# Patient Record
Sex: Male | Born: 1995 | Race: Black or African American | Hispanic: No | Marital: Single | Smoking: Never smoker
Health system: Southern US, Community
[De-identification: ages and names within clinical notes are randomized; demographics above are authoritative.]

## PROBLEM LIST (undated history)

## (undated) DIAGNOSIS — Z5189 Encounter for other specified aftercare: Secondary | ICD-10-CM

## (undated) HISTORY — DX: Encounter for other specified aftercare: Z51.89

## (undated) HISTORY — PX: ADENOIDECTOMY: SUR15

## (undated) HISTORY — PX: URETHRA SURGERY: SHX824

---
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
## 2018-08-19 ENCOUNTER — Ambulatory Visit (INDEPENDENT_AMBULATORY_CARE_PROVIDER_SITE_OTHER): Payer: BLUE CROSS/BLUE SHIELD | Admitting: Family Medicine

## 2018-08-19 ENCOUNTER — Encounter: Payer: Self-pay | Admitting: Family Medicine

## 2018-08-19 ENCOUNTER — Other Ambulatory Visit: Payer: Self-pay

## 2018-08-19 VITALS — BP 120/73 | HR 59 | Temp 98.5°F | Resp 16 | Ht 68.5 in | Wt 153.6 lb

## 2018-08-19 DIAGNOSIS — Z0000 Encounter for general adult medical examination without abnormal findings: Secondary | ICD-10-CM

## 2018-08-19 DIAGNOSIS — Z23 Encounter for immunization: Secondary | ICD-10-CM

## 2018-08-19 DIAGNOSIS — H9193 Unspecified hearing loss, bilateral: Secondary | ICD-10-CM | POA: Insufficient documentation

## 2018-08-19 NOTE — Assessment & Plan Note (Signed)
Subjective hearing loss Ear exam benign Will send to audiology for formal testing

## 2018-08-19 NOTE — Progress Notes (Signed)
Patient: Nicholas Walter, Male    DOB: July 08, 1995, 23 y.o.   MRN: [id number] Visit Date: 08/19/2018  Today's Provider: Lavon Paganini, MD   Chief Complaint  Patient presents with  . New Patient (Initial Visit)   Subjective:    Annual physical exam Nicholas Walter is a 23 y.o. male who presents today for health maintenance and complete physical. He feels well. He reports exercising by walking outdoors daily. He reports he is sleeping well.  ----------------------------------------------------------------- Some mild hearing loss in bilateral hears Wax build up? Happened previously when he was a child  Denies any other medical problems.    Review of Systems  Constitutional: Negative.   HENT: Positive for hearing loss.   Eyes: Negative.   Respiratory: Negative.   Cardiovascular: Negative.   Gastrointestinal: Negative.   Endocrine: Negative.   Genitourinary: Negative.   Musculoskeletal: Negative.   Skin: Negative.   Allergic/Immunologic: Negative.   Neurological: Negative.   Hematological: Negative.   Psychiatric/Behavioral: Negative.     Social History He  reports that he has never smoked. He has never used smokeless tobacco. He reports that he does not drink alcohol or use drugs. Social History   Socioeconomic History  . Marital status: Single    Spouse name: Not on file  . Number of children: Not on file  . Years of education: Not on file  . Highest education level: Not on file  Occupational History  . Not on file  Social Needs  . Financial resource strain: Not on file  . Food insecurity    Worry: Not on file    Inability: Not on file  . Transportation needs    Medical: Not on file    Non-medical: Not on file  Tobacco Use  . Smoking status: Never Smoker  . Smokeless tobacco: Never Used  Substance and Sexual Activity  . Alcohol use: Never    Frequency: Never  . Drug use: Never  . Sexual activity: Not Currently  Lifestyle  .  Physical activity    Days per week: Not on file    Minutes per session: Not on file  . Stress: Not on file  Relationships  . Social Herbalist on phone: Not on file    Gets together: Not on file    Attends religious service: Not on file    Active member of club or organization: Not on file    Attends meetings of clubs or organizations: Not on file    Relationship status: Not on file  Other Topics Concern  . Not on file  Social History Narrative  . Not on file    Patient Active Problem List   Diagnosis Date Noted  . Bilateral hearing loss 08/19/2018    Past Surgical History:  Procedure Laterality Date  . ADENOIDECTOMY    . URETHRA SURGERY      Family History  Family Status  Relation Name Status  . Mother  Alive  . Father  (Not Specified)  . Neg Hx  (Not Specified)   His family history includes Arthritis in his mother; Asthma in his mother; Healthy in his father; Hypertension in his mother.     No Known Allergies  Previous Medications   No medications on file    Patient Care Team: Virginia Crews, MD as PCP - General (Family Medicine)      Objective:   Vitals: BP 120/73   Pulse (!) 59  Temp 98.5 F (36.9 C) (Oral)   Resp 16   Ht 5' 8.5" (1.74 m)   Wt 153 lb 9.6 oz (69.7 kg)   BMI 23.02 kg/m    Physical Exam Vitals signs reviewed.  Constitutional:      General: He is not in acute distress.    Appearance: Normal appearance. He is well-developed. He is not diaphoretic.  HENT:     Head: Normocephalic and atraumatic.     Right Ear: Tympanic membrane, ear canal and external ear normal.     Left Ear: Tympanic membrane, ear canal and external ear normal.  Eyes:     General: No scleral icterus.    Conjunctiva/sclera: Conjunctivae normal.     Pupils: Pupils are equal, round, and reactive to light.  Neck:     Musculoskeletal: Neck supple.     Thyroid: No thyromegaly.  Cardiovascular:     Rate and Rhythm: Normal rate and regular rhythm.      Pulses: Normal pulses.     Heart sounds: Normal heart sounds. No murmur.  Pulmonary:     Effort: Pulmonary effort is normal. No respiratory distress.     Breath sounds: Normal breath sounds. No wheezing or rales.  Abdominal:     General: There is no distension.     Palpations: Abdomen is soft.     Tenderness: There is no abdominal tenderness.  Musculoskeletal:        General: No deformity.     Right lower leg: No edema.     Left lower leg: No edema.  Lymphadenopathy:     Cervical: No cervical adenopathy.  Skin:    General: Skin is warm and dry.     Capillary Refill: Capillary refill takes less than 2 seconds.     Findings: No rash.  Neurological:     Mental Status: He is alert and oriented to person, place, and time. Mental status is at baseline.  Psychiatric:        Mood and Affect: Mood normal.        Behavior: Behavior normal.        Thought Content: Thought content normal.      Depression Screen PHQ 2/9 Scores 08/19/2018  PHQ - 2 Score 0  PHQ- 9 Score 0      Assessment & Plan:     Routine Health Maintenance and Physical Exam  Exercise Activities and Dietary recommendations Goals   None     There is no immunization history for the selected administration types on file for this patient.  Health Maintenance  Topic Date Due  . HIV Screening  02/13/2009  . TETANUS/TDAP  02/13/2013  . INFLUENZA VACCINE  09/03/2018     Discussed health benefits of physical activity, and encouraged him to engage in regular exercise appropriate for his age and condition.    -------------------------------------------------------------------- Rae LipsI,Kathleen J Wolford,acting as a scribe for Shirlee LatchAngela Bacigalupo, MD.,have documented all relevant documentation on the behalf of Shirlee LatchAngela Bacigalupo, MD,as directed by  Shirlee LatchAngela Bacigalupo, MD while in the presence of Shirlee LatchAngela Bacigalupo, MD.   Problem List Items Addressed This Visit      Nervous and Auditory   Bilateral hearing loss     Subjective hearing loss Ear exam benign Will send to audiology for formal testing      Relevant Orders   Ambulatory referral to Audiology    Other Visit Diagnoses    Encounter for annual physical exam    -  Primary   Need for tetanus  booster       Relevant Orders   Tdap vaccine greater than or equal to 7yo IM       Return in about 1 year (around 08/19/2019) for CPE.   The entirety of the information documented in the History of Present Illness, Review of Systems and Physical Exam were personally obtained by me. Portions of this information were initially documented by Sheliah HatchKathleen Wolford, CMA and reviewed by me for thoroughness and accuracy.    Bacigalupo, Marzella SchleinAngela M, MD MPH [facility]

## 2018-08-19 NOTE — Patient Instructions (Signed)
Preventive Care 19-24 Years Old, Male Preventive care refers to lifestyle choices and visits with your health care provider that can promote health and wellness. This includes:  A yearly physical exam. This is also called an annual well check.  Regular dental and eye exams.  Immunizations.  Screening for certain conditions.  Healthy lifestyle choices, such as eating a healthy diet, getting regular exercise, not using drugs or products that contain nicotine and tobacco, and limiting alcohol use. What can I expect for my preventive care visit? Physical exam Your health care provider will check:  Height and weight. These may be used to calculate body mass index (BMI), which is a measurement that tells if you are at a healthy weight.  Heart rate and blood pressure.  Your skin for abnormal spots. Counseling Your health care provider may ask you questions about:  Alcohol, tobacco, and drug use.  Emotional well-being.  Home and relationship well-being.  Sexual activity.  Eating habits.  Work and work Statistician. What immunizations do I need?  Influenza (flu) vaccine  This is recommended every year. Tetanus, diphtheria, and pertussis (Tdap) vaccine  You may need a Td booster every 10 years. Varicella (chickenpox) vaccine  You may need this vaccine if you have not already been vaccinated. Human papillomavirus (HPV) vaccine  If recommended by your health care provider, you may need three doses over 6 months. Measles, mumps, and rubella (MMR) vaccine  You may need at least one dose of MMR. You may also need a second dose. Meningococcal conjugate (MenACWY) vaccine  One dose is recommended if you are 45-76 years old and a Market researcher living in a residence hall, or if you have one of several medical conditions. You may also need additional booster doses. Pneumococcal conjugate (PCV13) vaccine  You may need this if you have certain conditions and were not  previously vaccinated. Pneumococcal polysaccharide (PPSV23) vaccine  You may need one or two doses if you smoke cigarettes or if you have certain conditions. Hepatitis A vaccine  You may need this if you have certain conditions or if you travel or work in places where you may be exposed to hepatitis A. Hepatitis B vaccine  You may need this if you have certain conditions or if you travel or work in places where you may be exposed to hepatitis B. Haemophilus influenzae type b (Hib) vaccine  You may need this if you have certain risk factors. You may receive vaccines as individual doses or as more than one vaccine together in one shot (combination vaccines). Talk with your health care provider about the risks and benefits of combination vaccines. What tests do I need? Blood tests  Lipid and cholesterol levels. These may be checked every 5 years starting at age 17.  Hepatitis C test.  Hepatitis B test. Screening   Diabetes screening. This is done by checking your blood sugar (glucose) after you have not eaten for a while (fasting).  Sexually transmitted disease (STD) testing. Talk with your health care provider about your test results, treatment options, and if necessary, the need for more tests. Follow these instructions at home: Eating and drinking   Eat a diet that includes fresh fruits and vegetables, whole grains, lean protein, and low-fat dairy products.  Take vitamin and mineral supplements as recommended by your health care provider.  Do not drink alcohol if your health care provider tells you not to drink.  If you drink alcohol: ? Limit how much you have to 0-2  drinks a day. ? Be aware of how much alcohol is in your drink. In the [place] one drink equals one 12 oz bottle of beer (355 mL), one 5 oz glass of wine (148 mL), or one 1 oz glass of hard liquor (44 mL). Lifestyle  Take daily care of your teeth and gums.  Stay active. Exercise for at least 30 minutes on 5 or  more days each week.  Do not use any products that contain nicotine or tobacco, such as cigarettes, e-cigarettes, and chewing tobacco. If you need help quitting, ask your health care provider.  If you are sexually active, practice safe sex. Use a condom or other form of protection to prevent STIs (sexually transmitted infections). What's next?  Go to your health care provider once a year for a well check visit.  Ask your health care provider how often you should have your eyes and teeth checked.  Stay up to date on all vaccines. This information is not intended to replace advice given to you by your health care provider. Make sure you discuss any questions you have with your health care provider. Document Released: 03/17/2001 Document Revised: 01/13/2018 Document Reviewed: 01/13/2018 Elsevier Patient Education  2020 Elsevier Inc.  

## 2019-01-17 ENCOUNTER — Encounter: Payer: Self-pay | Admitting: Family Medicine

## 2019-01-17 ENCOUNTER — Ambulatory Visit (INDEPENDENT_AMBULATORY_CARE_PROVIDER_SITE_OTHER): Payer: BLUE CROSS/BLUE SHIELD | Admitting: Family Medicine

## 2019-01-17 ENCOUNTER — Other Ambulatory Visit: Payer: Self-pay

## 2019-01-17 VITALS — BP 127/68 | HR 71 | Temp 97.7°F | Resp 16 | Wt 158.4 lb

## 2019-01-17 DIAGNOSIS — Z9889 Other specified postprocedural states: Secondary | ICD-10-CM | POA: Diagnosis not present

## 2019-01-17 DIAGNOSIS — R339 Retention of urine, unspecified: Secondary | ICD-10-CM

## 2019-01-17 NOTE — Progress Notes (Signed)
Patient: Nicholas Walter Male    DOB: January 17, 1996   23 y.o.   MRN: [id number] Visit Date: 01/17/2019  Today's Provider: Shirlee Latch, MD   Chief Complaint  Patient presents with  . Body Fluid Exposure   Subjective:    I [place] S. Dimas, CMA, am acting as scribe for Shirlee Latch, MD.  HPI Patient here today C/O small urine output and hesitance. Patient reports this has been going on for several years. Patient reports that he does hold his urine often due to activities, work or sleep. Patient reports that when he does void the flow is very slow and voids in small amounts. Feels like he has some urine left in his bladder after emptying.  Thinks he may have seen urology about this.  Had urethra surgery as a child.  No dysuria, no frequency/urgency, no hematuria.   No Known Allergies  No current outpatient medications on file.  Review of Systems  Constitutional: Negative.   Cardiovascular: Negative.   Genitourinary: Positive for decreased urine volume and difficulty urinating. Negative for dysuria, enuresis, frequency and hematuria.    Social History   Tobacco Use  . Smoking status: Never Smoker  . Smokeless tobacco: Never Used  Substance Use Topics  . Alcohol use: Never      Objective:   BP 127/68 (BP Location: Right Arm, Patient Position: Sitting, Cuff Size: Normal)   Pulse 71   Temp 97.7 F (36.5 C) (Temporal)   Resp 16   Wt 158 lb 6.4 oz (71.8 kg)   BMI 23.73 kg/m  Vitals:   01/17/19 0914  BP: 127/68  Pulse: 71  Resp: 16  Temp: 97.7 F (36.5 C)  TempSrc: Temporal  Weight: 158 lb 6.4 oz (71.8 kg)  Body mass index is 23.73 kg/m.   Physical Exam Vitals reviewed.  Constitutional:      General: He is not in acute distress.    Appearance: Normal appearance. He is well-developed. He is not diaphoretic.  HENT:     Head: Normocephalic and atraumatic.  Eyes:     General: No scleral icterus.    Conjunctiva/sclera: Conjunctivae normal.    Neck:     Thyroid: No thyromegaly.  Cardiovascular:     Rate and Rhythm: Normal rate and regular rhythm.     Heart sounds: Normal heart sounds. No murmur.  Pulmonary:     Effort: Pulmonary effort is normal. No respiratory distress.     Breath sounds: Normal breath sounds. No wheezing or rales.  Abdominal:     General: There is no distension.     Palpations: Abdomen is soft.     Tenderness: There is no abdominal tenderness. There is no guarding or rebound.  Genitourinary:    Comments: Patient defers to urology Musculoskeletal:        General: No deformity.     Cervical back: Neck supple.     Right lower leg: No edema.     Left lower leg: No edema.  Lymphadenopathy:     Cervical: No cervical adenopathy.  Skin:    General: Skin is warm and dry.     Capillary Refill: Capillary refill takes less than 2 seconds.     Findings: No rash.  Neurological:     Mental Status: He is alert and oriented to person, place, and time. Mental status is at baseline.  Psychiatric:        Mood and Affect: Mood normal.  Behavior: Behavior normal.      No results found for any visits on 01/17/19. Patient unable to give urine sample today    Assessment & Plan    1. Urinary retention 2. History of urologic surgery - longstanding problem, but newly discussed - patient with history of urologic surgery in childhood (but history is poor, so unclear what exactly was done) - concern for possible urethral stricture or prostate etiology - no infectious signs/symptoms - referral to URology for further eval and management - Ambulatory referral to Urology    Return if symptoms worsen or fail to improve.   The entirety of the information documented in the History of Present Illness, Review of Systems and Physical Exam were personally obtained by me. Portions of this information were initially documented by Lynford Humphrey, CMA and reviewed by me for thoroughness and accuracy.    Darric Plante,  Dionne Bucy, MD MPH [facility]

## 2019-05-02 NOTE — Progress Notes (Signed)
   05/03/19 12:22 PM   Nicholas Walter 1995-08-11 [id number]  Referring provider: Erasmo Downer, MD [address] [place],  [place] [address]  Chief Complaint  Patient presents with  . Urinary Retention    HPI: 24 y.o. male presents today for the evaluation and management of LUTS.   -PCP visit 01/17/19 c/o small urine output and hesitancy for several years -Today, bladder problems onset 24 years old -hx of holding urine -unable to urinate today however reports of a shy bladder, PVR 658 mL  -intermittent worsening of syptoms over the years  -denies gross hematuria and infection  -hx of urethra surgery as child   PMH: Past Medical History:  Diagnosis Date  . Blood transfusion without reported diagnosis     Surgical History: Past Surgical History:  Procedure Laterality Date  . ADENOIDECTOMY    . URETHRA SURGERY      Home Medications:  Allergies as of 05/03/2019   No Known Allergies     Medication List    as of May 03, 2019 12:22 PM   You have not been prescribed any medications.     Allergies: No Known Allergies  Family History: Family History  Problem Relation Age of Onset  . Asthma Mother   . Arthritis Mother   . Hypertension Mother   . Healthy Father   . Cancer Neg Hx     Social History:  reports that he has never smoked. He has never used smokeless tobacco. He reports that he does not drink alcohol or use drugs.   Physical Exam: BP 108/72   Pulse 65   Ht 5\' 9"  (1.753 m)   Wt 160 lb (72.6 kg)   BMI 23.63 kg/m   Constitutional:  Alert and oriented, No acute distress. HEENT: [place] AT, moist mucus membranes.  Trachea midline, no masses. Cardiovascular: No clubbing, cyanosis, or edema. Respiratory: Normal respiratory effort, no increased work of breathing. GU: No CVA tenderness. Circumcised and widely patent meatus.  Skin: No rashes, bruises or suspicious lesions. Neurologic: Grossly intact, no focal deficits, moving all 4  extremities. Psychiatric: Normal mood and affect.  Pertinent Imaging: PVR - 658 mL.   Assessment & Plan:    - Lower urinary tract symptoms Pt was unable to urinate today however reports of a shy bladder and states he can empty at home without problem Rx of tamsulosin given  F/u in 1 month with for symptom reassessment and bladder scan    Willodean Rosenthal, MD  [facility] [address], [address] [place], [place] [phone]  I, [phone], am acting as a scribe for Dr. Donne Hazel C. Kaisen Ackers,  I have reviewed the above documentation for accuracy and completeness, and I agree with the above.   Lorin Picket, MD

## 2019-05-03 ENCOUNTER — Ambulatory Visit (INDEPENDENT_AMBULATORY_CARE_PROVIDER_SITE_OTHER): Payer: 59 | Admitting: Urology

## 2019-05-03 ENCOUNTER — Encounter: Payer: Self-pay | Admitting: Urology

## 2019-05-03 ENCOUNTER — Other Ambulatory Visit: Payer: Self-pay

## 2019-05-03 VITALS — BP 108/72 | HR 65 | Ht 69.0 in | Wt 160.0 lb

## 2019-05-03 DIAGNOSIS — R399 Unspecified symptoms and signs involving the genitourinary system: Secondary | ICD-10-CM

## 2019-05-03 MED ORDER — TAMSULOSIN HCL 0.4 MG PO CAPS: 0.4000 mg | ORAL_CAPSULE | Freq: Every day | ORAL | 0 refills | Status: AC

## 2019-05-08 ENCOUNTER — Telehealth: Payer: Self-pay | Admitting: Family Medicine

## 2019-05-08 NOTE — Telephone Encounter (Signed)
Copied from CRM [id number]. Topic: Quick Communication - Rx Refill/Question >> May 08, 2019  2:09 PM Randol Kern wrote: Medication: tamsulosin (FLOMAX) 0.4 MG CAPS capsule  Pt is experiencing side effects, runny/stuffy nose and made him feel drained, and also problems ejaculating.   [phone]

## 2019-06-02 ENCOUNTER — Encounter: Payer: Self-pay | Admitting: Urology

## 2019-06-02 ENCOUNTER — Other Ambulatory Visit: Payer: Self-pay

## 2019-06-02 ENCOUNTER — Ambulatory Visit (INDEPENDENT_AMBULATORY_CARE_PROVIDER_SITE_OTHER): Payer: 59 | Admitting: Urology

## 2019-06-02 VITALS — BP 112/67 | HR 53 | Ht 69.0 in | Wt 158.4 lb

## 2019-06-02 DIAGNOSIS — R399 Unspecified symptoms and signs involving the genitourinary system: Secondary | ICD-10-CM

## 2019-06-02 LAB — BLADDER SCAN AMB NON-IMAGING: Scan Result: 36

## 2019-06-05 LAB — URINALYSIS, COMPLETE
Bilirubin, UA: NEGATIVE
Glucose, UA: NEGATIVE
Ketones, UA: NEGATIVE
Leukocytes,UA: NEGATIVE
Nitrite, UA: NEGATIVE
Protein,UA: NEGATIVE
RBC, UA: NEGATIVE
Specific Gravity, UA: >1.03 — ABNORMAL HIGH (ref 1.005–1.030)
Urobilinogen, Ur: 0.2 mg/dL (ref 0.2–1.0)
pH, UA: 5.5 (ref 5.0–7.5)

## 2019-06-05 LAB — MICROSCOPIC EXAMINATION
Bacteria, UA: NONE SEEN
Epithelial Cells (non renal): NONE SEEN /hpf (ref 0–10)
RBC: NONE SEEN /hpf (ref 0–2)

## 2019-06-22 NOTE — Progress Notes (Signed)
06/02/2019 8:13 AM   [name] May 19, 1995 [id number]  Referring provider: Erasmo Downer, MD [address] [place],  [place] [address]  Chief Complaint  Patient presents with  . lower urinary tract symptoms    HPI: [name] is a 24 year old male with lower urinary tract symptoms who presents today for a one month follow up with mother, Nicholas Walter on the phone.   He was seen by Dr. Lonna Cobb on 05/03/2019.  At that visit, his PVR was 658 mL.  The patient expressed that he had ongoing issues with the onset of age 64.  He states has a shy bladder, but it capable of urinating at home to empty his bladder.  He was given tamsulosin 0.4 mg daily and instructed to follow up in one month.  LUTS  (prostate and/or bladder) IPSS score: 13/2    PVR: 36 mL      Major complaint(s): Frequency x several years.   Patient denies any modifying or aggravating factors.  Patient denies any gross hematuria, dysuria or suprapubic/flank pain.  Patient denies any fevers, chills, nausea or vomiting.   He is not wanting to continue the tamsulosin at this time.    IPSS    Row Name 06/02/19 1000         International Prostate Symptom Score   How often have you had the sensation of not emptying your bladder?  Less than half the time     How often have you had to urinate less than every two hours?  Less than 1 in 5 times     How often have you found you stopped and started again several times when you urinated?  Less than half the time     How often have you found it difficult to postpone urination?  About half the time     How often have you had a weak urinary stream?  Not at All     How often have you had to strain to start urination?  Less than half the time     How many times did you typically get up at night to urinate?  3 Times     Total IPSS Score  13       Quality of Life due to urinary symptoms   If you were to spend the rest of your life with your urinary condition just  the way it is now how would you feel about that?  Mostly Satisfied        Score:  1-7 Mild 8-19 Moderate 20-35 Severe     PMH: Past Medical History:  Diagnosis Date  . Blood transfusion without reported diagnosis     Surgical History: Past Surgical History:  Procedure Laterality Date  . ADENOIDECTOMY    . URETHRA SURGERY      Home Medications:  Allergies as of 06/02/2019   No Known Allergies     Medication List       Accurate as of June 02, 2019 11:59 PM. If you have any questions, ask your nurse or doctor.        tamsulosin 0.4 MG Caps capsule Commonly known as: FLOMAX Take 1 capsule (0.4 mg total) by mouth daily.       Allergies: No Known Allergies  Family History: Family History  Problem Relation Age of Onset  . Asthma Mother   . Arthritis Mother   . Hypertension Mother   . Healthy Father   . Cancer Neg  Hx     Social History:  reports that he has never smoked. He has never used smokeless tobacco. He reports that he does not drink alcohol or use drugs.  ROS: Pertinent ROS in HPI  Physical Exam: BP 112/67   Pulse (!) 53   Ht 5\' 9"  (1.753 m)   Wt 158 lb 6.4 oz (71.8 kg)   BMI 23.39 kg/m   Constitutional:  Well nourished. Alert and oriented, No acute distress. HEENT: [place] AT, mask in place.  Trachea midline Cardiovascular: No clubbing, cyanosis, or edema. Respiratory: Normal respiratory effort, no increased work of breathing. Neurologic: Grossly intact, no focal deficits, moving all 4 extremities. Psychiatric: Normal mood and affect.  Laboratory Data: Urinalysis Component     Latest Ref Rng & Units 06/02/2019  Specific Gravity, UA     1.005 - 1.030 >1.030 (H)  pH, UA     5.0 - 7.5 5.5  Color, UA     Yellow Yellow  Appearance Ur     Clear Clear  Leukocytes,UA     Negative Negative  Protein,UA     Negative/Trace Negative  Glucose, UA     Negative Negative  Ketones, UA     Negative Negative  RBC, UA     Negative Negative    Bilirubin, UA     Negative Negative  Urobilinogen, Ur     0.2 - 1.0 mg/dL 0.2  Nitrite, UA     Negative Negative  Microscopic Examination      See below:   Component     Latest Ref Rng & Units 06/02/2019  WBC, UA     0 - 5 /hpf 0-5  RBC     0 - 2 /hpf None seen  Epithelial Cells (non renal)     0 - 10 /hpf None seen  Bacteria, UA     None seen/Few None seen    I have reviewed the labs.   Pertinent Imaging: Results for TYR, FRANCA" (MRN [id number]) as of 06/22/2019 08:24  Ref. Range 06/02/2019 10:56  Scan Result Unknown 36    Assessment & Plan:    1. Lower urinary tract symptoms (LUTS) - Bladder Scan (Post Void Residual) in office - Urinalysis, Complete -In a three-way conversation with the patient's mother, he decided he did not want to take the tamsulosin or any other medication -He feels he is doing well and does not desire any further follow-up unless his symptoms worsen -I explained to him that the reason why he sending his bladder well today may be due to the tamsulosin and he could risk going into retention or bladder malfunction with high residuals, he states he understands this but is doing fine and will follow up as needed   Return if symptoms worsen or fail to improve.  These notes generated with voice recognition software. I apologize for typographical errors.  Zara Council, PA-C  [facility] [address]  [place], [place] [address] [phone]
